# Patient Record
Sex: Female | Born: 2000
Health system: Southern US, Community
[De-identification: ages and names within clinical notes are randomized; demographics above are authoritative.]

## PROBLEM LIST (undated history)

## (undated) DIAGNOSIS — F909 Attention-deficit hyperactivity disorder, unspecified type: Secondary | ICD-10-CM

## (undated) DIAGNOSIS — Z68.41 Body mass index (BMI) pediatric, greater than or equal to 95th percentile for age: Secondary | ICD-10-CM

## (undated) DIAGNOSIS — Z8701 Personal history of pneumonia (recurrent): Secondary | ICD-10-CM

## (undated) DIAGNOSIS — F4322 Adjustment disorder with anxiety: Secondary | ICD-10-CM

## (undated) DIAGNOSIS — E049 Nontoxic goiter, unspecified: Secondary | ICD-10-CM

## (undated) DIAGNOSIS — K219 Gastro-esophageal reflux disease without esophagitis: Secondary | ICD-10-CM

## (undated) DIAGNOSIS — T7840XA Allergy, unspecified, initial encounter: Secondary | ICD-10-CM

## (undated) HISTORY — DX: Nontoxic goiter, unspecified: E04.9

## (undated) HISTORY — DX: Attention-deficit hyperactivity disorder, unspecified type: F90.9

## (undated) HISTORY — DX: Gastro-esophageal reflux disease without esophagitis: K21.9

## (undated) HISTORY — DX: Allergy, unspecified, initial encounter: T78.40XA

## (undated) HISTORY — DX: Personal history of pneumonia (recurrent): Z87.01

## (undated) HISTORY — DX: Adjustment disorder with anxiety: F43.22

## (undated) HISTORY — DX: Body mass index (bmi) pediatric, greater than or equal to 95th percentile for age: Z68.54

---
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
## 2019-01-05 DIAGNOSIS — F4325 Adjustment disorder with mixed disturbance of emotions and conduct: Secondary | ICD-10-CM | POA: Diagnosis not present

## 2019-01-19 DIAGNOSIS — F4325 Adjustment disorder with mixed disturbance of emotions and conduct: Secondary | ICD-10-CM | POA: Diagnosis not present

## 2019-02-02 DIAGNOSIS — F4325 Adjustment disorder with mixed disturbance of emotions and conduct: Secondary | ICD-10-CM | POA: Diagnosis not present

## 2019-02-16 DIAGNOSIS — F4325 Adjustment disorder with mixed disturbance of emotions and conduct: Secondary | ICD-10-CM | POA: Diagnosis not present

## 2019-03-02 DIAGNOSIS — F4325 Adjustment disorder with mixed disturbance of emotions and conduct: Secondary | ICD-10-CM | POA: Diagnosis not present

## 2019-03-15 DIAGNOSIS — F4325 Adjustment disorder with mixed disturbance of emotions and conduct: Secondary | ICD-10-CM | POA: Diagnosis not present

## 2019-03-29 DIAGNOSIS — F4325 Adjustment disorder with mixed disturbance of emotions and conduct: Secondary | ICD-10-CM | POA: Diagnosis not present

## 2019-04-12 DIAGNOSIS — F4325 Adjustment disorder with mixed disturbance of emotions and conduct: Secondary | ICD-10-CM | POA: Diagnosis not present

## 2019-04-29 DIAGNOSIS — F4325 Adjustment disorder with mixed disturbance of emotions and conduct: Secondary | ICD-10-CM | POA: Diagnosis not present

## 2019-05-10 DIAGNOSIS — F4325 Adjustment disorder with mixed disturbance of emotions and conduct: Secondary | ICD-10-CM | POA: Diagnosis not present

## 2019-05-24 DIAGNOSIS — F4325 Adjustment disorder with mixed disturbance of emotions and conduct: Secondary | ICD-10-CM | POA: Diagnosis not present

## 2019-06-03 ENCOUNTER — Encounter: Payer: Self-pay | Admitting: Internal Medicine

## 2019-06-03 ENCOUNTER — Other Ambulatory Visit: Payer: Self-pay

## 2019-06-03 ENCOUNTER — Ambulatory Visit (INDEPENDENT_AMBULATORY_CARE_PROVIDER_SITE_OTHER): Payer: BC Managed Care – PPO | Admitting: Internal Medicine

## 2019-06-03 VITALS — BP 120/72 | HR 117 | Temp 98.7°F | Wt 183.4 lb

## 2019-06-03 DIAGNOSIS — J45909 Unspecified asthma, uncomplicated: Secondary | ICD-10-CM

## 2019-06-03 DIAGNOSIS — F4321 Adjustment disorder with depressed mood: Secondary | ICD-10-CM

## 2019-06-03 DIAGNOSIS — Z79899 Other long term (current) drug therapy: Secondary | ICD-10-CM | POA: Diagnosis not present

## 2019-06-03 DIAGNOSIS — F909 Attention-deficit hyperactivity disorder, unspecified type: Secondary | ICD-10-CM | POA: Diagnosis not present

## 2019-06-03 MED ORDER — METHYLPHENIDATE HCL ER 10 MG PO TBCR: 10.0000 mg | EXTENDED_RELEASE_TABLET | Freq: Every day | ORAL | 0 refills | Status: DC

## 2019-06-03 NOTE — Progress Notes (Signed)
This visit occurred during the SARS-CoV-2 public health emergency.  Safety protocols were in place, including screening questions prior to the visit, additional usage of staff PPE, and extensive cleaning of exam room while observing appropriate contact time as indicated for disinfecting solutions.     Chief Complaint  Patient presents with  . Medication Refill    pt also wants to discuss depression   . Asthma  . ADHD    HPI: Sydney [name] 19 y.o. come in for Chronic disease management   Asthma :   Rescue recently    Days out fo  Out of  Daily   Controller  About  2 weeks.  Using moms a bit.  Allergy triggers  Pets  2 dogs and  cats  adhd and meds  Not taking regular   Cause out of school   Dropped out of school   And in [place] out and then accommodations  weren't ablt to help .   Difficult   Tutors  3 x per week .  2 hours .  [place].    College.   [employer] plans .   Fall .    AM arising  7 30  Tutoring 10 - 2 .    Mood :   Pandemic  A problem  For mood   Shuts down  Stays in room . Not edaing  emitional all the time   Readings    Escapes.  Friend  hwhose mom has MS.   Not related to  period has counselor every 2 weeks  Not suicidal see phq9 8   No tad had episode of etoh and got sick  Mom and fa,mily supportive to  Avoid  Pt aware  ROS: See pertinent positives and negatives per HPI.  Past Medical History:  Diagnosis Date  . ADHD (attention deficit hyperactivity disorder)   . Adjustment disorder with anxious mood 09/26/2014   Is problematic and affecting behavior see multiple changes in caretakers over time but good support advised developmental pediatrician pediatric psych, counseli   . Allergy   . Asthma   . BMI (body mass index), pediatric, 95-99% for age 26/13/2014   counseled    . Family disruption 07/28/2010  . GERD (gastroesophageal reflux disease)   . Goiter   . Hx: recurrent pneumonia    seen [facility]  neg   asthma related    Family History  Problem Relation Age of  Onset  . Asthma Mother   . Bipolar disorder Mother        hx of substance abuse in past   . Thyroid disease Mother   . Other Mother        djd c spine myelopathy, drug abuse      Outpatient Medications Prior to Visit  Medication Sig Dispense Refill  . albuterol (PROVENTIL) (2.5 MG/3ML) 0.083% nebulizer solution USE 1 VIAL VIA NEBULIZER EVERY 6 HOURS AS NEEDED FOR WHEEZING 1080 mL 0  . albuterol (VENTOLIN HFA) 108 (90 Base) MCG/ACT inhaler NHALE 2 PUFFS INTO LUNGS EVERY 6 HRS AS NEEDED FOR WHEEZING 54 Inhaler 0  . ARNUITY ELLIPTA 100 MCG/ACT AEPB INHALE 1 PUFF DAILY 30 each 4  . Fluticasone Furoate (ARNUITY ELLIPTA) 100 MCG/ACT AEPB Inhale 1 Act into the lungs daily. 3 each 3  . MELATONIN PO Take by mouth.    . methylphenidate (RITALIN) 5 MG tablet At 2 pm on long school days or as directed 45 tablet 0  . methylphenidate (CONCERTA) 36 MG  PO CR tablet Take 1 tablet (36 mg total) by mouth daily. 30 tablet 0   No facility-administered medications prior to visit.     EXAM:  BP 120/72 (BP Location: Right Arm, Patient Position: Sitting, Cuff Size: Normal)   Pulse (!) 117   Temp 98.7 F (37.1 C) (Temporal)   Wt 183 lb 6.4 oz (83.2 kg)   SpO2 99%   BMI 29.60 kg/m   Body mass index is 29.6 kg/m.  GENERAL: vitals reviewed and listed above, alert, oriented, appears well hydrated and in no acute distress HEENT: atraumatic, conjunctiva  clear, no obvious abnormalities on inspection of external nose and ears OP : masked  NECK: no obvious masses on inspection palpation  LUNGS: clear to auscultation bilaterally, no wheezes, rales or rhonchi, good air movement CV: HRRR, no clubbing cyanosis or  peripheral edema nl cap refill  MS: moves all extremities without noticeable focal  abnormality PSYCH: pleasant and cooperative, no obvious depression or anxiety nl speech and good insight  PHQ 9- 6  Lab Results  Component Value Date   WBC 8.3 10/09/2017   HGB 13.4 10/09/2017   HCT 40.0  10/09/2017   PLT 355.0 10/09/2017   GLUCOSE 94 10/09/2017   CHOL 148 10/09/2017   TRIG 34.0 10/09/2017   HDL 67.70 10/09/2017   LDLCALC 73 10/09/2017   ALT 16 10/09/2017   AST 19 10/09/2017   NA 139 10/09/2017   K 4.5 10/09/2017   CL 103 10/09/2017   CREATININE 0.54 10/09/2017   BUN 8 10/09/2017   CO2 27 10/09/2017   TSH 1.23 10/09/2017   BP Readings from Last 3 Encounters:  06/03/19 120/72  05/11/18 114/62  10/09/17 (!) 120/60 (79 %, Z = 0.82 /  21 %, Z = -0.79)*   *BP percentiles are based on the 2017 AAP Clinical Practice Guideline for girls    ASSESSMENT AND PLAN:  Discussed the following assessment and plan:  Attention deficit hyperactivity disorder (ADHD), unspecified ADHD type  Uncomplicated asthma, unspecified asthma severity, unspecified whether persistent - cost of controller to expensive check with other covered   Medication management  Adjustment disorder with depressed mood - realted to pandemic shut down first year collefe  aggravated  mom family hx of sud   is supportive, pt with adhd and ld Discussed using intermediate action methylphenidate as opposed to as needed immediate release.  Tried extended release Metadate 10 mg consideration of increasing to 20 mg. She states that the Concerta may have aggravated anxiety at some point. Insurance coverage for her controller inhaler which since she has been off is had to use her rescue more often. She will check with insurance sign up for my chart send [place] the information about which controllers inhalers are preferred in her insurance plan and check for coupons online.  Continue her counseling discussed strategies at this point medication not indicated and she is not wanting to do this anyway Plan follow-up visit in about 2 months to assess all status. Outside external source  DATA REVIEWED:   Total time on date  of service including record review ordering and plan of care:   Plan  40 minutes   -Patient advised  to return or notify health care team  if  new concerns arise.  Patient Instructions  Good to see you today  Contact insurance  About which controller inhjalres fopr asthma are preferred and covered   At best tier   We can use  Combo  Controllers  symbicort  hfa advair  Discus or hfa  dulera To name few    Otherwise flovent q var or pulmicort or similar   Try medium lasting  Methylphenidate  5-8 hours lasting low dose.  Continue therapy  Get out side daylight every day  Try other readings  Try socialization  Time   Plan rov virtual or other in about 2 months or as needed to see how doing with asthma and meds etc    Neta Mends. Lanore Renderos M.D.

## 2019-06-03 NOTE — Patient Instructions (Addendum)
Good to see you today  Contact insurance  About which controller inhjalres fopr asthma are preferred and covered   At best tier   We can use  Combo  Controllers  symbicort  hfa advair  Discus or hfa  dulera To name few    Otherwise flovent q var or pulmicort or similar   Try medium lasting  Methylphenidate  5-8 hours lasting low dose.  Continue therapy  Get out side daylight every day  Try other readings  Try socialization  Time   Plan rov virtual or other in about 2 months or as needed to see how doing with asthma and meds etc

## 2019-06-07 DIAGNOSIS — F4325 Adjustment disorder with mixed disturbance of emotions and conduct: Secondary | ICD-10-CM | POA: Diagnosis not present

## 2019-06-21 DIAGNOSIS — F4325 Adjustment disorder with mixed disturbance of emotions and conduct: Secondary | ICD-10-CM | POA: Diagnosis not present

## 2019-07-05 DIAGNOSIS — F4325 Adjustment disorder with mixed disturbance of emotions and conduct: Secondary | ICD-10-CM | POA: Diagnosis not present

## 2019-07-19 DIAGNOSIS — F4325 Adjustment disorder with mixed disturbance of emotions and conduct: Secondary | ICD-10-CM | POA: Diagnosis not present

## 2019-08-01 ENCOUNTER — Telehealth: Payer: BC Managed Care – PPO | Admitting: Internal Medicine

## 2019-08-16 DIAGNOSIS — F4325 Adjustment disorder with mixed disturbance of emotions and conduct: Secondary | ICD-10-CM | POA: Diagnosis not present

## 2019-08-30 DIAGNOSIS — F4325 Adjustment disorder with mixed disturbance of emotions and conduct: Secondary | ICD-10-CM | POA: Diagnosis not present

## 2019-09-13 DIAGNOSIS — F4325 Adjustment disorder with mixed disturbance of emotions and conduct: Secondary | ICD-10-CM | POA: Diagnosis not present

## 2019-10-05 DIAGNOSIS — F4325 Adjustment disorder with mixed disturbance of emotions and conduct: Secondary | ICD-10-CM | POA: Diagnosis not present

## 2019-10-07 ENCOUNTER — Other Ambulatory Visit: Payer: Self-pay | Admitting: Internal Medicine

## 2019-10-27 DIAGNOSIS — F4325 Adjustment disorder with mixed disturbance of emotions and conduct: Secondary | ICD-10-CM | POA: Diagnosis not present

## 2019-12-05 ENCOUNTER — Telehealth: Payer: Self-pay | Admitting: Internal Medicine

## 2019-12-05 NOTE — Telephone Encounter (Signed)
Pt call and want a refill on methylphenidate 10 MG ER tablet for a 30 days supply  [id number] [place], [place] - [address] Phone:  [phone]  Fax:  [phone]

## 2019-12-05 NOTE — Telephone Encounter (Signed)
Last OV 06/03/2019  Last filled 06/03/2019, # 30 with 0 refills

## 2019-12-07 MED ORDER — METHYLPHENIDATE HCL ER 10 MG PO TBCR: 10.0000 mg | EXTENDED_RELEASE_TABLET | Freq: Every day | ORAL | 0 refills | Status: DC

## 2019-12-07 NOTE — Telephone Encounter (Signed)
Sent in electronically .  Needs visit before further refills

## 2019-12-07 NOTE — Addendum Note (Signed)
Addended byBerniece Andreas K on: 12/07/2019 01:46 PM   Modules accepted: Orders

## 2019-12-07 NOTE — Telephone Encounter (Signed)
Needs  Fu 6 months after this refill

## 2019-12-08 NOTE — Telephone Encounter (Signed)
Called patient and scheduled a follow up visit on 12/14/2019 at 2:30pm. Patient verbalized an understanding.

## 2019-12-13 ENCOUNTER — Other Ambulatory Visit: Payer: Self-pay

## 2019-12-14 ENCOUNTER — Encounter: Payer: Self-pay | Admitting: Internal Medicine

## 2019-12-14 ENCOUNTER — Ambulatory Visit (INDEPENDENT_AMBULATORY_CARE_PROVIDER_SITE_OTHER): Payer: Self-pay | Admitting: Internal Medicine

## 2019-12-14 VITALS — BP 116/76 | HR 130 | Temp 99.2°F | Ht 66.0 in | Wt 173.4 lb

## 2019-12-14 DIAGNOSIS — F909 Attention-deficit hyperactivity disorder, unspecified type: Secondary | ICD-10-CM

## 2019-12-14 DIAGNOSIS — J45909 Unspecified asthma, uncomplicated: Secondary | ICD-10-CM

## 2019-12-14 DIAGNOSIS — Z79899 Other long term (current) drug therapy: Secondary | ICD-10-CM

## 2019-12-14 MED ORDER — FLOVENT HFA 110 MCG/ACT IN AERO: 1.0000 | INHALATION_SPRAY | Freq: Two times a day (BID) | RESPIRATORY_TRACT | 5 refills | Status: DC

## 2019-12-14 NOTE — Patient Instructions (Signed)
Sent in  Preventive inhaler Flovent to use 1 puff  Twice a day   ( once a day may be enough )   Try taking 20 mg pr ER methylphenidate   Late morning mid day for your classes and studies  ( and work )  Then  Contact  Me in about 2 weeks  About  Dosing .    depending  We can  rx for 20 mg per day  Instead of add on med .   Otherwise   rov in 4-6 months

## 2019-12-14 NOTE — Progress Notes (Signed)
Chief Complaint  Patient presents with  . Follow-up    Doing okay  . Medication Management  . Asthma  . ADHD    HPI: Sydney [name] 19 y.o. come in for Chronic disease management   ADHD/ADD    [employer] 2 classes eng and psych 11 am and 3 pm class med   Er last about 3 hours  Ir 1 hour   If takes 10 er late then effects sleep       Wonders if add on ir vs  Other  Had mood ans se of adderall   Asthma : Summer did ok.  This   at night  2 puffs  Controller inhaler  Not covered by  [employer] and when called said cant tell her what is covered  flovent   Using her moms  Extra with some help   Sleep   6-7 hours  No tad activity yoga 2 x per week    And personal trainer .  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   Mood:  Doing ok  Not as stressed   About school but "ok"   Still has therapist   ROS: See pertinent positives and negatives per HPI. No cp  Periods ok   Past Medical History:  Diagnosis Date  . ADHD (attention deficit hyperactivity disorder)   . Adjustment disorder with anxious mood 09/26/2014   Is problematic and affecting behavior see multiple changes in caretakers over time but good support advised developmental pediatrician pediatric psych, counseli   . Allergy   . Asthma   . BMI (body mass index), pediatric, 95-99% for age 62/13/2014   counseled    . Family disruption 07/28/2010  . GERD (gastroesophageal reflux disease)   . Goiter   . Hx: recurrent pneumonia    seen [facility]  neg   asthma related    Family History  Problem Relation Age of Onset  . Asthma Mother   . Bipolar disorder Mother        hx of substance abuse in past   . Thyroid disease Mother   . Other Mother        djd c spine myelopathy, drug abuse    Social History   Socioeconomic History  . Marital status: Single    Spouse name: Not on file  . Number of children: Not on file  . Years of education: Not on file  . Highest education level: Not on file  Occupational History  . Not on  file  Tobacco Use  . Smoking status: Never Smoker  . Smokeless tobacco: Never Used  Vaping Use  . Vaping Use: Never used  Substance and Sexual Activity  . Alcohol use: No  . Drug use: No  . Sexual activity: Not on file  Other Topics Concern  . Not on file  Social History Narrative   zwas living with gm for 2-3 months 2015    Pet schnauzer   [employer]  7th grade    Mom remarried   Husband left  Mom Jan 2012   Mom relapsed with codien given for her chronic neck pain and tried to detox herself   rehab and doing well as expected.     MOM relapsed again  2015    To live with aunt in [place]  For now   2016 mom is now one year sober Sydney [name] is living with her in an apartment in [place].   To be  home schooled 9th grade    Social Determinants of Health   Financial Resource Strain:   . Difficulty of Paying Living Expenses: Not on file  Food Insecurity:   . Worried About Programme researcher, broadcasting/film/video in the Last Year: Not on file  . Ran Out of Food in the Last Year: Not on file  Transportation Needs:   . Lack of Transportation (Medical): Not on file  . Lack of Transportation (Non-Medical): Not on file  Physical Activity:   . Days of Exercise per Week: Not on file  . Minutes of Exercise per Session: Not on file  Stress:   . Feeling of Stress : Not on file  Social Connections:   . Frequency of Communication with Friends and Family: Not on file  . Frequency of Social Gatherings with Friends and Family: Not on file  . Attends Religious Services: Not on file  . Active Member of Clubs or Organizations: Not on file  . Attends Banker Meetings: Not on file  . Marital Status: Not on file    Outpatient Medications Prior to Visit  Medication Sig Dispense Refill  . albuterol (PROVENTIL) (2.5 MG/3ML) 0.083% nebulizer solution USE 1 VIAL VIA NEBULIZER EVERY 6 HOURS AS NEEDED FOR WHEEZING 1080 mL 0  . albuterol (VENTOLIN HFA) 108 (90 Base) MCG/ACT inhaler INHALE  2 PUFFS INTO LUNGS EVERY 6 HOURS AS NEEDED FOR WHEEZING 18 g 1  . ARNUITY ELLIPTA 100 MCG/ACT AEPB INHALE 1 PUFF DAILY 30 each 4  . Fluticasone Furoate (ARNUITY ELLIPTA) 100 MCG/ACT AEPB Inhale 1 Act into the lungs daily. 3 each 3  . MELATONIN PO Take by mouth.    . methylphenidate 10 MG ER tablet Take 1 tablet (10 mg total) by mouth daily. Make appt before  any further refills 30 tablet 0  . methylphenidate (RITALIN) 5 MG tablet At 2 pm on long school days or as directed (Patient not taking: Reported on 12/14/2019) 45 tablet 0   No facility-administered medications prior to visit.     EXAM:  BP 116/76   Pulse (!) 130   Temp 99.2 F (37.3 C) (Oral)   Ht 5\' 6"  (1.676 m)   Wt 173 lb 6.4 oz (78.7 kg)   SpO2 98%   BMI 27.99 kg/m   Body mass index is 27.99 kg/m. Wt Readings from Last 3 Encounters:  12/14/19 173 lb 6.4 oz (78.7 kg)  06/03/19 183 lb 6.4 oz (83.2 kg) (95 %, Z= 1.67)*  05/11/18 151 lb 3.2 oz (68.6 kg) (84 %, Z= 1.00)*   * Growth percentiles are based on CDC (Girls, 2-20 Years) data.    GENERAL: vitals reviewed and listed above, alert, oriented, appears well hydrated and in no acute distress HEENT: atraumatic, conjunctiva  clear, no obvious abnormalities on inspection of external nose and ears OP : masked  NECK: no obvious masses on inspection palpation  LUNGS: clear to auscultation bilaterally, no wheezes, rales or rhonchi, good air movement CV: HRRR, no clubbing cyanosis or  peripheral edema nl cap refill  Abdomen:  Sof,t normal bowel sounds without hepatosplenomegaly, no guarding rebound or masses no CVA tenderness MS: moves all extremities without noticeable focal  abnormality PSYCH: pleasant and cooperative, no obvious depression or anxiety Lab Results  Component Value Date   WBC 8.3 10/09/2017   HGB 13.4 10/09/2017   HCT 40.0 10/09/2017   PLT 355.0 10/09/2017   GLUCOSE 94 10/09/2017   CHOL 148 10/09/2017   TRIG 34.0 10/09/2017  HDL 67.70 10/09/2017    LDLCALC 73 10/09/2017   ALT 16 10/09/2017   AST 19 10/09/2017   NA 139 10/09/2017   K 4.5 10/09/2017   CL 103 10/09/2017   CREATININE 0.54 10/09/2017   BUN 8 10/09/2017   CO2 27 10/09/2017   TSH 1.23 10/09/2017   BP Readings from Last 3 Encounters:  12/14/19 116/76  06/03/19 120/72  05/11/18 114/62    ASSESSMENT AND PLAN:  Discussed the following assessment and plan:  Attention deficit hyperactivity disorder (ADHD), unspecified ADHD type  Uncomplicated asthma, unspecified asthma severity, unspecified whether persistent  Medication management - insurance barrier  rx flovent to see if paid  Try 20 mg er methylphenidate  late am and see how covers time for class and  Studies without effecting sleep  34 minutes reviewe visit disc and counsel -Patient advised to return or notify health care team  if  new concerns arise.  Patient Instructions  Sent in  Preventive inhaler Flovent to use 1 puff  Twice a day   ( once a day may be enough )   Try taking 20 mg pr ER methylphenidate   Late morning mid day for your classes and studies  ( and work )  Then  Contact  Me in about 2 weeks  About  Dosing .    depending  We can  rx for 20 mg per day  Instead of add on med .   Otherwise   rov in 4-6 months          Neta Mends. Erian Rosengren M.D.

## 2019-12-26 DIAGNOSIS — F411 Generalized anxiety disorder: Secondary | ICD-10-CM | POA: Diagnosis not present

## 2020-01-09 DIAGNOSIS — F411 Generalized anxiety disorder: Secondary | ICD-10-CM | POA: Diagnosis not present

## 2020-01-23 DIAGNOSIS — F411 Generalized anxiety disorder: Secondary | ICD-10-CM | POA: Diagnosis not present

## 2020-02-20 DIAGNOSIS — F411 Generalized anxiety disorder: Secondary | ICD-10-CM | POA: Diagnosis not present

## 2020-02-27 ENCOUNTER — Encounter: Payer: Self-pay | Admitting: Internal Medicine

## 2020-02-27 ENCOUNTER — Other Ambulatory Visit: Payer: Self-pay

## 2020-02-27 ENCOUNTER — Ambulatory Visit: Payer: BC Managed Care – PPO | Admitting: Internal Medicine

## 2020-02-27 VITALS — BP 110/60 | HR 113 | Temp 98.9°F | Ht 66.0 in | Wt 175.0 lb

## 2020-02-27 DIAGNOSIS — M25562 Pain in left knee: Secondary | ICD-10-CM

## 2020-02-27 DIAGNOSIS — M222X2 Patellofemoral disorders, left knee: Secondary | ICD-10-CM | POA: Diagnosis not present

## 2020-02-27 NOTE — Progress Notes (Signed)
Chief Complaint  Patient presents with  . Knee Pain    left knee pain    HPI: Sydney [name] 19 y.o. come in for  New problem knee pain .  Left knee has been bothering her for couple of months beginning as a pain peripatellar lateral without a specific injury that has progressed over time so that now it is hurting her badly every time she walks up the stairs.   Notices it is yoga but no specific injury or fall or history of same  Excruciating at times  And not so painful popping.  When she is turning. No extra  Exercise walking and yoga.  Happens when she is going upstairs walking a certain way but not when sedentary. ROS: See pertinent positives and negatives per HPI.  Past Medical History:  Diagnosis Date  . ADHD (attention deficit hyperactivity disorder)   . Adjustment disorder with anxious mood 09/26/2014   Is problematic and affecting behavior see multiple changes in caretakers over time but good support advised developmental pediatrician pediatric psych, counseli   . Allergy   . Asthma   . BMI (body mass index), pediatric, 95-99% for age 83/13/2014   counseled    . Family disruption 07/28/2010  . GERD (gastroesophageal reflux disease)   . Goiter   . Hx: recurrent pneumonia    seen [facility]  neg   asthma related    Family History  Problem Relation Age of Onset  . Asthma Mother   . Bipolar disorder Mother        hx of substance abuse in past   . Thyroid disease Mother   . Other Mother        djd c spine myelopathy, drug abuse    Social History   Socioeconomic History  . Marital status: Single    Spouse name: Not on file  . Number of children: Not on file  . Years of education: Not on file  . Highest education level: Not on file  Occupational History  . Not on file  Tobacco Use  . Smoking status: Never Smoker  . Smokeless tobacco: Never Used  Vaping Use  . Vaping Use: Never used  Substance and Sexual Activity  . Alcohol use: No  . Drug use: No  . Sexual  activity: Not on file  Other Topics Concern  . Not on file  Social History Narrative   zwas living with gm for 2-3 months 2015    Pet schnauzer   [employer]  7th grade    Mom remarried   Husband left  Mom Jan 2012   Mom relapsed with codien given for her chronic neck pain and tried to detox herself   rehab and doing well as expected.     MOM relapsed again  2015    To live with aunt in [place]  For now   2016 mom is now one year sober [place] is living with her in an apartment in [place].   To be home schooled 9th grade    Social Determinants of Health   Financial Resource Strain:   . Difficulty of Paying Living Expenses: Not on file  Food Insecurity:   . Worried About Programme researcher, broadcasting/film/video in the Last Year: Not on file  . Ran Out of Food in the Last Year: Not on file  Transportation Needs:   . Lack of Transportation (Medical): Not on file  . Lack of Transportation (Non-Medical): Not on file  Physical Activity:   . Days of Exercise per Week: Not on file  . Minutes of Exercise per Session: Not on file  Stress:   . Feeling of Stress : Not on file  Social Connections:   . Frequency of Communication with Friends and Family: Not on file  . Frequency of Social Gatherings with Friends and Family: Not on file  . Attends Religious Services: Not on file  . Active Member of Clubs or Organizations: Not on file  . Attends Banker Meetings: Not on file  . Marital Status: Not on file    Outpatient Medications Prior to Visit  Medication Sig Dispense Refill  . albuterol (PROVENTIL) (2.5 MG/3ML) 0.083% nebulizer solution USE 1 VIAL VIA NEBULIZER EVERY 6 HOURS AS NEEDED FOR WHEEZING 1080 mL 0  . albuterol (VENTOLIN HFA) 108 (90 Base) MCG/ACT inhaler INHALE 2 PUFFS INTO LUNGS EVERY 6 HOURS AS NEEDED FOR WHEEZING 18 g 1  . fluticasone (FLOVENT HFA) 110 MCG/ACT inhaler Inhale 1 puff into the lungs in the morning and at bedtime. For asthma control 1 each 5  .  MELATONIN PO Take by mouth.    . methylphenidate (RITALIN) 5 MG tablet At 2 pm on long school days or as directed 45 tablet 0  . ARNUITY ELLIPTA 100 MCG/ACT AEPB INHALE 1 PUFF DAILY (Patient not taking: Reported on 02/27/2020) 30 each 4  . Fluticasone Furoate (ARNUITY ELLIPTA) 100 MCG/ACT AEPB Inhale 1 Act into the lungs daily. (Patient not taking: Reported on 02/27/2020) 3 each 3  . methylphenidate 10 MG ER tablet Take 1 tablet (10 mg total) by mouth daily. Make appt before  any further refills (Patient not taking: Reported on 02/27/2020) 30 tablet 0   No facility-administered medications prior to visit.     EXAM:  BP 110/60   Pulse (!) 113   Temp 98.9 F (37.2 C) (Oral)   Ht 5\' 6"  (1.676 m)   Wt 175 lb (79.4 kg)   SpO2 99%   BMI 28.25 kg/m   Body mass index is 28.25 kg/m.  GENERAL: vitals reviewed and listed above, alert, oriented, appears well hydrated and in no acute distress HEENT: atraumatic, conjunctiva  clear, no obvious abnormalities on inspection of external nose and ears OP : Masked NECK: no obvious masses on inspection palpation   CV: HRRR, no clubbing cyanosis or  peripheral edema nl cap refill  MS: moves all extremities without noticeable focal  abnormality no warmth or redness left knee slight peripatellar effusion range of motion normal passive slight grinding positive apprehension points to tenderness in the lateral patellar area no locking noted. PSYCH: pleasant and cooperative, no obvious depression or anxiety  BP Readings from Last 3 Encounters:  02/27/20 110/60  12/14/19 116/76  06/03/19 120/72    ASSESSMENT AND PLAN:  Discussed the following assessment and plan:  Left knee pain, unspecified chronicity - Plan: Ambulatory referral to Sports Medicine  Anterior knee pain, left - Plan: Ambulatory referral to Sports Medicine  Patellofemoral disorder of left knee - Plan: Ambulatory referral to Sports Medicine Left lateral peripatellar pain progressing  and problematic with walking up stairs without a specific injury Expectant management temporary ice anti-inflammatory topical Referral to sports medicine for further evaluation treatment management. Discussed bracing and potential differential diagnosis.  -Patient advised to return or notify health care team  if  new concerns arise.  Patient Instructions  consider  Patellar tracking   Problem  And possible   patellar subluxation .  Often  treated with  Physical therapy bracing etc .   Ice at end of day .   Topical Voltaren  Gel or similar otc   Up to 4 x pr day ( antiinflammatory . ) ' You will   be contacted about sport medicine referral .      Patellofemoral Pain Syndrome  Patellofemoral pain syndrome is a condition in which the tissue (cartilage) on the underside of the kneecap (patella) softens or breaks down. This causes pain in the front of the knee. The condition is also called runner's knee or chondromalacia patella. Patellofemoral pain syndrome is most common in young adults who are active in sports. The knee is the largest joint in the body. The patella covers the front of the knee and is attached to muscles above and below the knee. The underside of the patella is covered with a smooth type of cartilage (synovium). The smooth surface helps the patella to glide easily when you move your knee. Patellofemoral pain syndrome causes swelling in the joint linings and bone surfaces in the knee. What are the causes? This condition may be caused by:  Overuse of the knee.  Poor alignment of your knee joints.  Weak leg muscles.  A direct blow to your kneecap. What increases the risk? You are more likely to develop this condition if:  You do a lot of activities that can wear down your kneecap. These include: ? Running. ? Squatting. ? Climbing stairs.  You start a new physical activity or exercise program.  You wear shoes that do not fit well.  You do not have good leg  strength.  You are overweight. What are the signs or symptoms? The main symptom of this condition is knee pain. This may feel like a dull, aching pain underneath your patella, in the front of your knee. There may be a popping or cracking sound when you move your knee. Pain may get worse with:  Exercise.  Climbing stairs.  Running.  Jumping.  Squatting.  Kneeling.  Sitting for a long time.  Moving or pushing on your patella. How is this diagnosed? This condition may be diagnosed based on:  Your symptoms and medical history. You may be asked about your recent physical activities and which ones cause knee pain.  A physical exam. This may include: ? Moving your patella back and forth. ? Checking your range of knee motion. ? Having you squat or jump to see if you have pain. ? Checking the strength of your leg muscles.  Imaging tests to confirm the diagnosis. These may include an MRI of your knee. How is this treated? This condition may be treated at home with rest, ice, compression, and elevation (RICE).  Other treatments may include:  Nonsteroidal anti-inflammatory drugs (NSAIDs).  Physical therapy to stretch and strengthen your leg muscles.  Shoe inserts (orthotics) to take stress off your knee.  A knee brace or knee support.  Adhesive tapes to the skin.  Surgery to remove damaged cartilage or move the patella to a better position. This is rare. Follow these instructions at home: If you have a shoe or brace:  Wear the shoe or brace as told by your health care provider. Remove it only as told by your health care provider.  Loosen the shoe or brace if your toes tingle, become numb, or turn cold and blue.  Keep the shoe or brace clean.  If the shoe or brace is not waterproof: ? Do not let it get wet. ?  Cover it with a watertight covering when you take a bath or a shower. Managing pain, stiffness, and swelling  If directed, put ice on the painful area. ? If you  have a removable shoe or brace, remove it as told by your health care provider. ? Put ice in a plastic bag. ? Place a towel between your skin and the bag. ? Leave the ice on for 20 minutes, 2-3 times a day.  Move your toes often to avoid stiffness and to lessen swelling.  Rest your knee: ? Avoid activities that cause knee pain. ? When sitting or lying down, raise (elevate) the injured area above the level of your heart, whenever possible. General instructions  Take over-the-counter and prescription medicines only as told by your health care provider.  Use splints, braces, knee supports, or walking aids as directed by your health care provider.  Perform stretching and strengthening exercises as told by your health care provider or physical therapist.  Do not use any products that contain nicotine or tobacco, such as cigarettes and e-cigarettes. These can delay healing. If you need help quitting, ask your health care provider.  Return to your normal activities as told by your health care provider. Ask your health care provider what activities are safe for you.  Keep all follow-up visits as told by your health care provider. This is important. Contact a health care provider if:  Your symptoms get worse.  You are not improving with home care. Summary  Patellofemoral pain syndrome is a condition in which the tissue (cartilage) on the underside of the kneecap (patella) softens or breaks down.  This condition causes swelling in the joint linings and bone surfaces in the knee. This leads to pain in the front of the knee.  This condition may be treated at home with rest, ice, compression, and elevation (RICE).  Use splints, braces, knee supports, or walking aids as directed by your health care provider. This information is not intended to replace advice given to you by your health care provider. Make sure you discuss any questions you have with your health care provider. Document  Revised: 05/11/2017 Document Reviewed: 05/11/2017 Elsevier Patient Education  2020 [employer]        MorenciWanda K. Shaka Cardin M.D.

## 2020-02-27 NOTE — Patient Instructions (Addendum)
consider  Patellar tracking   Problem  And possible   patellar subluxation .  Often treated with  Physical therapy bracing etc .   Ice at end of day .   Topical Voltaren  Gel or similar otc   Up to 4 x pr day ( antiinflammatory . ) ' You will   be contacted about sport medicine referral .      Patellofemoral Pain Syndrome  Patellofemoral pain syndrome is a condition in which the tissue (cartilage) on the underside of the kneecap (patella) softens or breaks down. This causes pain in the front of the knee. The condition is also called runner's knee or chondromalacia patella. Patellofemoral pain syndrome is most common in young adults who are active in sports. The knee is the largest joint in the body. The patella covers the front of the knee and is attached to muscles above and below the knee. The underside of the patella is covered with a smooth type of cartilage (synovium). The smooth surface helps the patella to glide easily when you move your knee. Patellofemoral pain syndrome causes swelling in the joint linings and bone surfaces in the knee. What are the causes? This condition may be caused by:  Overuse of the knee.  Poor alignment of your knee joints.  Weak leg muscles.  A direct blow to your kneecap. What increases the risk? You are more likely to develop this condition if:  You do a lot of activities that can wear down your kneecap. These include: ? Running. ? Squatting. ? Climbing stairs.  You start a new physical activity or exercise program.  You wear shoes that do not fit well.  You do not have good leg strength.  You are overweight. What are the signs or symptoms? The main symptom of this condition is knee pain. This may feel like a dull, aching pain underneath your patella, in the front of your knee. There may be a popping or cracking sound when you move your knee. Pain may get worse with:  Exercise.  Climbing  stairs.  Running.  Jumping.  Squatting.  Kneeling.  Sitting for a long time.  Moving or pushing on your patella. How is this diagnosed? This condition may be diagnosed based on:  Your symptoms and medical history. You may be asked about your recent physical activities and which ones cause knee pain.  A physical exam. This may include: ? Moving your patella back and forth. ? Checking your range of knee motion. ? Having you squat or jump to see if you have pain. ? Checking the strength of your leg muscles.  Imaging tests to confirm the diagnosis. These may include an MRI of your knee. How is this treated? This condition may be treated at home with rest, ice, compression, and elevation (RICE).  Other treatments may include:  Nonsteroidal anti-inflammatory drugs (NSAIDs).  Physical therapy to stretch and strengthen your leg muscles.  Shoe inserts (orthotics) to take stress off your knee.  A knee brace or knee support.  Adhesive tapes to the skin.  Surgery to remove damaged cartilage or move the patella to a better position. This is rare. Follow these instructions at home: If you have a shoe or brace:  Wear the shoe or brace as told by your health care provider. Remove it only as told by your health care provider.  Loosen the shoe or brace if your toes tingle, become numb, or turn cold and blue.  Keep the shoe or brace  clean.  If the shoe or brace is not waterproof: ? Do not let it get wet. ? Cover it with a watertight covering when you take a bath or a shower. Managing pain, stiffness, and swelling  If directed, put ice on the painful area. ? If you have a removable shoe or brace, remove it as told by your health care provider. ? Put ice in a plastic bag. ? Place a towel between your skin and the bag. ? Leave the ice on for 20 minutes, 2-3 times a day.  Move your toes often to avoid stiffness and to lessen swelling.  Rest your knee: ? Avoid activities that  cause knee pain. ? When sitting or lying down, raise (elevate) the injured area above the level of your heart, whenever possible. General instructions  Take over-the-counter and prescription medicines only as told by your health care provider.  Use splints, braces, knee supports, or walking aids as directed by your health care provider.  Perform stretching and strengthening exercises as told by your health care provider or physical therapist.  Do not use any products that contain nicotine or tobacco, such as cigarettes and e-cigarettes. These can delay healing. If you need help quitting, ask your health care provider.  Return to your normal activities as told by your health care provider. Ask your health care provider what activities are safe for you.  Keep all follow-up visits as told by your health care provider. This is important. Contact a health care provider if:  Your symptoms get worse.  You are not improving with home care. Summary  Patellofemoral pain syndrome is a condition in which the tissue (cartilage) on the underside of the kneecap (patella) softens or breaks down.  This condition causes swelling in the joint linings and bone surfaces in the knee. This leads to pain in the front of the knee.  This condition may be treated at home with rest, ice, compression, and elevation (RICE).  Use splints, braces, knee supports, or walking aids as directed by your health care provider. This information is not intended to replace advice given to you by your health care provider. Make sure you discuss any questions you have with your health care provider. Document Revised: 05/11/2017 Document Reviewed: 05/11/2017 Elsevier Patient Education  2020 [employer].

## 2020-03-05 DIAGNOSIS — F411 Generalized anxiety disorder: Secondary | ICD-10-CM | POA: Diagnosis not present

## 2020-03-19 ENCOUNTER — Ambulatory Visit: Payer: BC Managed Care – PPO | Admitting: Family Medicine

## 2020-04-02 DIAGNOSIS — F411 Generalized anxiety disorder: Secondary | ICD-10-CM | POA: Diagnosis not present

## 2020-04-16 DIAGNOSIS — F411 Generalized anxiety disorder: Secondary | ICD-10-CM | POA: Diagnosis not present

## 2020-04-30 DIAGNOSIS — F411 Generalized anxiety disorder: Secondary | ICD-10-CM | POA: Diagnosis not present

## 2020-05-14 DIAGNOSIS — F411 Generalized anxiety disorder: Secondary | ICD-10-CM | POA: Diagnosis not present

## 2020-05-28 DIAGNOSIS — F4325 Adjustment disorder with mixed disturbance of emotions and conduct: Secondary | ICD-10-CM | POA: Diagnosis not present

## 2020-06-11 DIAGNOSIS — F411 Generalized anxiety disorder: Secondary | ICD-10-CM | POA: Diagnosis not present

## 2020-07-09 DIAGNOSIS — F411 Generalized anxiety disorder: Secondary | ICD-10-CM | POA: Diagnosis not present

## 2020-07-23 DIAGNOSIS — F411 Generalized anxiety disorder: Secondary | ICD-10-CM | POA: Diagnosis not present

## 2020-08-02 DIAGNOSIS — L700 Acne vulgaris: Secondary | ICD-10-CM | POA: Diagnosis not present

## 2020-08-02 DIAGNOSIS — L219 Seborrheic dermatitis, unspecified: Secondary | ICD-10-CM | POA: Diagnosis not present

## 2020-08-06 DIAGNOSIS — F411 Generalized anxiety disorder: Secondary | ICD-10-CM | POA: Diagnosis not present

## 2020-08-20 DIAGNOSIS — F411 Generalized anxiety disorder: Secondary | ICD-10-CM | POA: Diagnosis not present

## 2020-09-03 DIAGNOSIS — F411 Generalized anxiety disorder: Secondary | ICD-10-CM | POA: Diagnosis not present

## 2020-09-17 DIAGNOSIS — F411 Generalized anxiety disorder: Secondary | ICD-10-CM | POA: Diagnosis not present

## 2020-09-18 ENCOUNTER — Other Ambulatory Visit: Payer: Self-pay | Admitting: Internal Medicine

## 2020-10-01 DIAGNOSIS — F411 Generalized anxiety disorder: Secondary | ICD-10-CM | POA: Diagnosis not present

## 2020-10-15 DIAGNOSIS — F411 Generalized anxiety disorder: Secondary | ICD-10-CM | POA: Diagnosis not present

## 2020-10-29 DIAGNOSIS — F411 Generalized anxiety disorder: Secondary | ICD-10-CM | POA: Diagnosis not present

## 2020-11-02 DIAGNOSIS — L700 Acne vulgaris: Secondary | ICD-10-CM | POA: Diagnosis not present

## 2020-11-02 DIAGNOSIS — D2239 Melanocytic nevi of other parts of face: Secondary | ICD-10-CM | POA: Diagnosis not present

## 2020-11-26 DIAGNOSIS — F411 Generalized anxiety disorder: Secondary | ICD-10-CM | POA: Diagnosis not present

## 2020-12-10 DIAGNOSIS — F411 Generalized anxiety disorder: Secondary | ICD-10-CM | POA: Diagnosis not present

## 2020-12-24 DIAGNOSIS — F411 Generalized anxiety disorder: Secondary | ICD-10-CM | POA: Diagnosis not present

## 2021-01-07 DIAGNOSIS — F411 Generalized anxiety disorder: Secondary | ICD-10-CM | POA: Diagnosis not present

## 2021-01-21 DIAGNOSIS — F411 Generalized anxiety disorder: Secondary | ICD-10-CM | POA: Diagnosis not present

## 2021-01-24 ENCOUNTER — Telehealth: Payer: BC Managed Care – PPO | Admitting: Internal Medicine

## 2021-01-24 ENCOUNTER — Encounter: Payer: Self-pay | Admitting: Internal Medicine

## 2021-01-24 VITALS — Ht 66.0 in | Wt 175.0 lb

## 2021-01-24 DIAGNOSIS — Z79899 Other long term (current) drug therapy: Secondary | ICD-10-CM

## 2021-01-24 DIAGNOSIS — J45909 Unspecified asthma, uncomplicated: Secondary | ICD-10-CM

## 2021-01-24 DIAGNOSIS — F909 Attention-deficit hyperactivity disorder, unspecified type: Secondary | ICD-10-CM | POA: Diagnosis not present

## 2021-01-24 MED ORDER — ALBUTEROL SULFATE HFA 108 (90 BASE) MCG/ACT IN AERS: INHALATION_SPRAY | RESPIRATORY_TRACT | 3 refills | Status: DC

## 2021-01-24 MED ORDER — METHYLPHENIDATE HCL ER 20 MG PO TBCR: 20.0000 mg | EXTENDED_RELEASE_TABLET | Freq: Every day | ORAL | 0 refills | Status: DC

## 2021-01-24 MED ORDER — BUDESONIDE-FORMOTEROL FUMARATE 160-4.5 MCG/ACT IN AERO: 2.0000 | INHALATION_SPRAY | Freq: Two times a day (BID) | RESPIRATORY_TRACT | 6 refills | Status: DC

## 2021-01-24 NOTE — Progress Notes (Signed)
Virtual Visit via Video Note  I connected with Sydney [name]  on 01/24/21 at  9:00 AM EDT by a video enabled telemedicine application and verified that I am speaking with the correct person using two identifiers. Location patient: home Location provider:work or home office Persons participating in the virtual visit: patient, provider  [place] national recommendations  regarding COVID 19 pandemic   video visit is advised over in office visit for this patient.  Patient aware  of the limitations of evaluation and management by telemedicine and  availability of in person appointments. and agreed to proceed.   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  She is noticing that she is less focused off of the medicine and not doing as well and her mother noticed the same.  She had been taking 10 mg extended release methylphenidate did not last very long but was quite helpful in the past.  Would like to try a higher dose she had no significant side effects.  Sleep closer to 7 hours negative TD occasional rare alcohol.  It is physically active likes yoga. Currently living at home but will be having a different living arrangement in the near future.   Asthma: Has been pretty stable ran out of her controller medicine and has been using her mom's Symbicort which seems to be quite well occasional use of albuterol once every few weeks.  Would like to try Symbicort instead of her previous inhaler. Uncertain if she has refills on the albuterol.  Mood has been stable no significant depression anxiety at this point. She does have a yearly follow-up visit in November.  In person.  ROS: See pertinent positives and negatives per HPI.  Past Medical History:  Diagnosis Date   ADHD (attention deficit hyperactivity disorder)    Adjustment disorder with anxious mood 09/26/2014   Is  problematic and affecting behavior see multiple changes in caretakers over time but good support advised developmental pediatrician pediatric psych, counseli    Allergy    Asthma    BMI (body mass index), pediatric, 95-99% for age 48/13/2014   counseled     Family disruption 07/28/2010   GERD (gastroesophageal reflux disease)    Goiter    Hx: recurrent pneumonia    seen [facility]  neg   asthma related    History reviewed. No pertinent surgical history.  Family History  Problem Relation Age of Onset   Asthma Mother    Bipolar disorder Mother        hx of substance abuse in past    Thyroid disease Mother    Other Mother        djd c spine myelopathy, drug abuse    Social History   Tobacco Use   Smoking status: Never   Smokeless tobacco: Never  Vaping Use   Vaping Use: Never used  Substance Use Topics   Alcohol use: No   Drug use: No      Current Outpatient Medications:    albuterol (PROVENTIL) (2.5 MG/3ML) 0.083% nebulizer solution, USE 1 VIAL VIA NEBULIZER EVERY 6 HOURS AS NEEDED FOR WHEEZING, Disp: 1080 mL, Rfl: 0   budesonide-formoterol (SYMBICORT) 160-4.5 MCG/ACT inhaler, Inhale 2 puffs into the lungs 2 (two) times daily., Disp: 1 each, Rfl: 6   MELATONIN PO, Take  by mouth., Disp: , Rfl:    methylphenidate (METADATE ER) 20 MG ER tablet, Take 1 tablet (20 mg total) by mouth daily., Disp: 30 tablet, Rfl: 0   methylphenidate (RITALIN) 5 MG tablet, At 2 pm on long school days or as directed, Disp: 45 tablet, Rfl: 0   albuterol (VENTOLIN HFA) 108 (90 Base) MCG/ACT inhaler, INHALE 2 PUFFS INTO LUNGS EVERY 6 HOURS AS NEEDED FOR WHEEZING, Disp: 8.5 each, Rfl: 3  EXAM: BP Readings from Last 3 Encounters:  02/27/20 110/60  12/14/19 116/76  06/03/19 120/72    VITALS per patient if applicable:  GENERAL: alert, oriented, appears well and in no acute distress  HEENT: atraumatic, conjunttiva clear, no obvious abnormalities on inspection of external nose and ears  NECK:  normal movements of the head and neck  LUNGS: on inspection no signs of respiratory distress, breathing rate appears normal, no obvious gross SOB, gasping or wheezing  CV: no obvious cyanosis  MS: moves all visible extremities without noticeable abnormality  PSYCH/NEURO: pleasant and cooperative, no obvious depression or anxiety, speech and thought processing grossly intact Lab Results  Component Value Date   WBC 8.3 10/09/2017   HGB 13.4 10/09/2017   HCT 40.0 10/09/2017   PLT 355.0 10/09/2017   GLUCOSE 94 10/09/2017   CHOL 148 10/09/2017   TRIG 34.0 10/09/2017   HDL 67.70 10/09/2017   LDLCALC 73 10/09/2017   ALT 16 10/09/2017   AST 19 10/09/2017   NA 139 10/09/2017   K 4.5 10/09/2017   CL 103 10/09/2017   CREATININE 0.54 10/09/2017   BUN 8 10/09/2017   CO2 27 10/09/2017   TSH 1.23 10/09/2017    ASSESSMENT AND PLAN:  Discussed the following assessment and plan:    ICD-10-CM   1. Attention deficit hyperactivity disorder (ADHD), unspecified ADHD type  F90.9     2. Medication management  Z79.899     3. Uncomplicated asthma, unspecified asthma severity, unspecified whether persistent  J45.909      Agree  benefit from  restarting  stimulant med and inc dose to 20 mg   cont other life style helps.   Then fu at her pv med visit nex month  Asthma controlled  on ics laba using moms as ran out currently rare  sig falr can use rescue every few weeks.  Counseled.   Expectant management and discussion of plan and treatment with opportunity to ask questions and all were answered. The patient agreed with the plan and demonstrated an understanding of the instructions.   Advised to call back or seek an in-person evaluation if worsening  or having  further concerns . In niterim  Return for upcoming  in person visit  and fu med .    Berniece Andreas, MD

## 2021-02-04 DIAGNOSIS — L700 Acne vulgaris: Secondary | ICD-10-CM | POA: Diagnosis not present

## 2021-02-18 DIAGNOSIS — F411 Generalized anxiety disorder: Secondary | ICD-10-CM | POA: Diagnosis not present

## 2021-02-24 ENCOUNTER — Other Ambulatory Visit: Payer: Self-pay

## 2021-02-25 ENCOUNTER — Encounter: Payer: BC Managed Care – PPO | Admitting: Internal Medicine

## 2021-02-25 MED ORDER — METHYLPHENIDATE HCL ER 20 MG PO TBCR: 20.0000 mg | EXTENDED_RELEASE_TABLET | Freq: Every day | ORAL | 0 refills | Status: DC

## 2021-02-25 NOTE — Telephone Encounter (Signed)
Thanks for the update   I sent in refill

## 2021-02-25 NOTE — Telephone Encounter (Signed)
Sent in electronically .  

## 2021-03-04 DIAGNOSIS — F411 Generalized anxiety disorder: Secondary | ICD-10-CM | POA: Diagnosis not present

## 2021-03-18 DIAGNOSIS — F411 Generalized anxiety disorder: Secondary | ICD-10-CM | POA: Diagnosis not present

## 2021-04-10 ENCOUNTER — Encounter: Payer: Self-pay | Admitting: Internal Medicine

## 2021-04-10 ENCOUNTER — Ambulatory Visit (INDEPENDENT_AMBULATORY_CARE_PROVIDER_SITE_OTHER): Payer: BC Managed Care – PPO | Admitting: Internal Medicine

## 2021-04-10 VITALS — BP 114/80 | HR 96 | Temp 98.8°F | Ht 66.0 in | Wt 166.2 lb

## 2021-04-10 DIAGNOSIS — Z0000 Encounter for general adult medical examination without abnormal findings: Secondary | ICD-10-CM

## 2021-04-10 DIAGNOSIS — J45909 Unspecified asthma, uncomplicated: Secondary | ICD-10-CM | POA: Diagnosis not present

## 2021-04-10 DIAGNOSIS — Z2821 Immunization not carried out because of patient refusal: Secondary | ICD-10-CM

## 2021-04-10 DIAGNOSIS — F909 Attention-deficit hyperactivity disorder, unspecified type: Secondary | ICD-10-CM

## 2021-04-10 DIAGNOSIS — Z79899 Other long term (current) drug therapy: Secondary | ICD-10-CM | POA: Diagnosis not present

## 2021-04-10 MED ORDER — FLUTICASONE FUROATE-VILANTEROL 100-25 MCG/ACT IN AEPB: 1.0000 | INHALATION_SPRAY | Freq: Every day | RESPIRATORY_TRACT | 6 refills | Status: DC

## 2021-04-10 NOTE — Progress Notes (Signed)
Chief Complaint  Patient presents with   Annual Exam    Not fasting    HPI: Patient  DESIA SABAN  20 y.o. comes in today for Preventive Health Care visit  and med eval  ADHD methylphenidate  is Great to help her  8 30 am    tends when wears off slowly  .  Definitely by 5 pm .  No mood swings  and no sig untoward se.  Asthma recently a bit wonky  but not bad   rescue  about 3/7 days  at night  Periods irregular for 2 mos .  Bleeds 4 days  some cramps 2 d .    Lance on getting her GYN check declined to check for STI risk for pregnancy Health Maintenance  Topic Date Due   Pneumococcal Vaccine 32-36 Years old (2 - PCV) 12/27/2008   HIV Screening  Never done   Hepatitis C Screening  Never done   INFLUENZA VACCINE  11/12/2020   PAP-Cervical Cytology Screening  Never done   PAP SMEAR-Modifier  Never done   TETANUS/TDAP  12/02/2021   HPV VACCINES  Completed   Health Maintenance Review LIFESTYLE:  Exercise:    yoga and  puppy.  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  School in Jan soph  nursing [place].  Her mom is doing well  ROS:  REST of 12 system review negative except as per HPI   Past Medical History:  Diagnosis Date   ADHD (attention deficit hyperactivity disorder)    Adjustment disorder with anxious mood 09/26/2014   Is problematic and affecting behavior see multiple changes in caretakers over time but good support advised developmental pediatrician pediatric psych, counseli    Allergy    Asthma    BMI (body mass index), pediatric, 95-99% for age 56/13/2014   counseled     Family disruption 07/28/2010   GERD (gastroesophageal reflux disease)    Goiter    Hx: recurrent pneumonia    seen [facility]  neg   asthma related    History reviewed. No pertinent surgical history.  Family History  Problem Relation Age of Onset   Asthma  Mother    Bipolar disorder Mother        hx of substance abuse in past    Thyroid disease Mother    Other Mother        djd c spine myelopathy, drug abuse    Social History   Socioeconomic History   Marital status: Single    Spouse name: Not on file   Number of children: Not on file   Years of education: Not on file   Highest education level: Not on file  Occupational History   Not on file  Tobacco Use   Smoking status: Never   Smokeless tobacco: Never  Vaping Use   Vaping Use: Never used  Substance and Sexual Activity   Alcohol use: No   Drug use: No   Sexual activity: Not on file  Other Topics Concern   Not on file  Social History Narrative   zwas living with gm for 2-3 months 2015    Pet schnauzer   [employer] school  7th grade    Mom remarried   Husband left  Mom Jan  2012   Mom relapsed with codien given for her chronic neck pain and tried to detox herself   rehab and doing well as expected.     MOM relapsed again  2015    To live with aunt in [place]  For now   2016 mom is now one year sober Yager is living with her in an apartment in [place].   To be home schooled 9th grade    Social Determinants of Health   Financial Resource Strain: Not on file  Food Insecurity: Not on file  Transportation Needs: Not on file  Physical Activity: Not on file  Stress: Not on file  Social Connections: Not on file    Outpatient Medications Prior to Visit  Medication Sig Dispense Refill   albuterol (PROVENTIL) (2.5 MG/3ML) 0.083% nebulizer solution USE 1 VIAL VIA NEBULIZER EVERY 6 HOURS AS NEEDED FOR WHEEZING 1080 mL 0   albuterol (VENTOLIN HFA) 108 (90 Base) MCG/ACT inhaler INHALE 2 PUFFS INTO LUNGS EVERY 6 HOURS AS NEEDED FOR WHEEZING 8.5 each 3   MELATONIN PO Take by mouth.     methylphenidate (METADATE ER) 20 MG ER tablet Take 1 tablet (20 mg total) by mouth daily. 30 tablet 0   methylphenidate (RITALIN) 5 MG tablet At 2 pm on long school days or as  directed 45 tablet 0   budesonide-formoterol (SYMBICORT) 160-4.5 MCG/ACT inhaler Inhale 2 puffs into the lungs 2 (two) times daily. 1 each 6   No facility-administered medications prior to visit.     EXAM:  BP 114/80 (BP Location: Left Arm, Patient Position: Sitting, Cuff Size: Normal)    Pulse 96    Temp 98.8 F (37.1 C) (Oral)    Ht 5\' 6"  (1.676 m)    Wt 166 lb 3.2 oz (75.4 kg)    SpO2 99%    BMI 26.83 kg/m   Body mass index is 26.83 kg/m. Wt Readings from Last 3 Encounters:  04/10/21 166 lb 3.2 oz (75.4 kg)  01/24/21 175 lb (79.4 kg)  02/27/20 175 lb (79.4 kg)    Physical Exam: Vital signs reviewed 02/29/20 is a well-developed well-nourished alert cooperative    who appearsr stated age in no acute distress.  HEENT: normocephalic atraumatic , Eyes: PERRL EOM's full, conjunctiva clear, glasses ECK: supple without masses, or bruits.  Goiter present no specific nodules no tenderness CHEST/PULM:  Clear to auscultation and percussion breath sounds equal no wheeze , rales or rhonchi. No chest wall deformities or tenderness. Breast: normal by inspection . No dimpling, discharge, masses, tenderness or discharge . CV: PMI is nondisplaced, S1 S2 no gallops, murmurs, rubs. Peripheral pulses are full without delay.No JVD .  ABDOMEN: Bowel sounds normal nontender  No guard or rebound, no hepato splenomegal no CVA tenderness.   Extremtities:  No clubbing cyanosis or edema, no acute joint swelling or redness no focal atrophy NEURO:  Oriented x3, cranial nerves 3-12 appear to be intact, no obvious focal weakness,gait within normal limits no abnormal reflexes or asymmetrical SKIN: No acute rashes normal turgor, color, no bruising or petechiae. PSYCH: Oriented, good eye contact, no obvious depression anxiety, cognition and judgment appear normal. LN: no cervical axillary inguinal adenopathy  Lab Results  Component Value Date   WBC 8.3 10/09/2017   HGB 13.4 10/09/2017   HCT 40.0 10/09/2017    PLT 355.0 10/09/2017   GLUCOSE 94 10/09/2017   CHOL 148 10/09/2017   TRIG 34.0 10/09/2017   HDL 67.70 10/09/2017   LDLCALC 73  10/09/2017   ALT 16 10/09/2017   AST 19 10/09/2017   NA 139 10/09/2017   K 4.5 10/09/2017   CL 103 10/09/2017   CREATININE 0.54 10/09/2017   BUN 8 10/09/2017   CO2 27 10/09/2017   TSH 1.23 10/09/2017    BP Readings from Last 3 Encounters:  04/10/21 114/80  02/27/20 110/60  12/14/19 116/76      ASSESSMENT AND PLAN:  Discussed the following assessment and plan:    ICD-10-CM   1. Visit for preventive health examination  Z00.00     2. Medication management  Z79.899     3. Attention deficit hyperactivity disorder (ADHD), unspecified ADHD type  F90.9     4. Uncomplicated asthma, unspecified asthma severity, unspecified whether persistent  J45.909     5. Influenza vaccination declined by patient today  Z42.21     Insurance not covering controller inhaler currently using rescue about 3 times a week could be doing better Try sending in [place] discussed with pharmacy she did try calling insurance and did not get anywhere.  Her mom is on the same insurance and Symbicort apparently is covered. At this time she seems to be doing well on methylphenidate extended release no need for add-on although when school starts there may be times if she has a prolonged lab to do short acting out on.  She will let us know. Glad she is doing well follow-up 6 months consideration in the next year or so would repeat cholesterol lipids and thyroid and blood sugar anemia check. Declined flu vaccine today Return in about 6 months (around 10/09/2021) for medication lab if needed .  Patient Care Team: Sherrye Puga, Neta Mends, MD as PCP - General Kozlow, Alvira Philips, MD (General Practice) Patient Instructions  Good to see  you today .   Will try a different controller inhaler to see if insurance covers.   Same   med methylphenidate  contact us for refill when needed . Get more sleep    ...  Plan 6 months med check  Consider updating labs next year thyroid cholesterol  blood sugar  anemia check .   Pap usually done around  age 61 or soon there after depending on risk    Neta Mends. Girtha Kilgore M.D.

## 2021-04-10 NOTE — Patient Instructions (Addendum)
Good to see  you today .   Will try a different controller inhaler to see if insurance covers.   Same   med methylphenidate  contact us for refill when needed . Get more sleep   ...  Plan 6 months med check  Consider updating labs next year thyroid cholesterol  blood sugar  anemia check .   Pap usually done around  age 20 or soon there after depending on risk

## 2021-04-15 DIAGNOSIS — F411 Generalized anxiety disorder: Secondary | ICD-10-CM | POA: Diagnosis not present

## 2021-05-13 DIAGNOSIS — F411 Generalized anxiety disorder: Secondary | ICD-10-CM | POA: Diagnosis not present

## 2021-05-27 DIAGNOSIS — F411 Generalized anxiety disorder: Secondary | ICD-10-CM | POA: Diagnosis not present

## 2021-06-10 DIAGNOSIS — F411 Generalized anxiety disorder: Secondary | ICD-10-CM | POA: Diagnosis not present

## 2021-06-24 ENCOUNTER — Other Ambulatory Visit: Payer: Self-pay | Admitting: Internal Medicine

## 2021-06-25 ENCOUNTER — Telehealth: Payer: Self-pay | Admitting: Internal Medicine

## 2021-06-25 MED ORDER — METHYLPHENIDATE HCL ER 20 MG PO TBCR
20.0000 mg | EXTENDED_RELEASE_TABLET | Freq: Every day | ORAL | 0 refills | Status: DC
Start: 2021-06-25 — End: 2021-08-11

## 2021-06-25 NOTE — Telephone Encounter (Signed)
Rx sent to the pharmacy.

## 2021-06-25 NOTE — Telephone Encounter (Signed)
Last Ov 04/10/21 ?Filled 02/25/21 ?Is it ok to refill? ?

## 2021-06-25 NOTE — Telephone Encounter (Signed)
Patient called in requesting a refill for methylphenidate (METADATE ER) 20 MG ER tablet [id number] to be sent to her pharmacy. ? ?Please advise. ?

## 2021-07-08 DIAGNOSIS — F411 Generalized anxiety disorder: Secondary | ICD-10-CM | POA: Diagnosis not present

## 2021-07-22 DIAGNOSIS — F411 Generalized anxiety disorder: Secondary | ICD-10-CM | POA: Diagnosis not present

## 2021-08-05 DIAGNOSIS — F411 Generalized anxiety disorder: Secondary | ICD-10-CM | POA: Diagnosis not present

## 2021-08-08 ENCOUNTER — Telehealth: Payer: Self-pay | Admitting: Internal Medicine

## 2021-08-08 NOTE — Telephone Encounter (Signed)
Patient would like refill for 90 day supply on methylphenidate (METADATE ER) 20 MG ER tablet ? ? ? ? ? ?Please send to ?[id number] [place], [place] - [address] Phone:  [phone]  ?Fax:  [phone]  ?  ? ? ? ? ? ? ?Please advise  ?

## 2021-08-08 NOTE — Telephone Encounter (Signed)
Last Ov 04/10/22  ?Filled 06/25/21 ?Is it ok to refill? ?

## 2021-08-11 MED ORDER — METHYLPHENIDATE HCL ER 20 MG PO TBCR: 20.0000 mg | EXTENDED_RELEASE_TABLET | Freq: Every day | ORAL | 0 refills | Status: DC

## 2021-08-11 NOTE — Telephone Encounter (Signed)
Sent in electronically .  

## 2021-08-11 NOTE — Addendum Note (Signed)
Addended byShanon Ace K on: 08/11/2021 04:50 PM ? ? Modules accepted: Orders ? ?

## 2022-01-06 ENCOUNTER — Telehealth: Payer: Self-pay | Admitting: Internal Medicine

## 2022-01-07 ENCOUNTER — Telehealth: Payer: Self-pay | Admitting: Internal Medicine

## 2022-01-07 NOTE — Telephone Encounter (Addendum)
Patient called to schedule a surgical clearance visit with MD. Pt is having surgery (Rhinoplasty) on 01/28/22.   MD had nothing available for Pt's time frame, but agreed to let Pt see another provider to get surgical clearance.  Called Pt, left message for her to call back to schedule an OV for surgical clearance with another provider.

## 2022-01-07 NOTE — Telephone Encounter (Signed)
Please disregard

## 2022-01-10 NOTE — Telephone Encounter (Signed)
Pt called for surgical clearance on 01/07/22. MD did not have anything available within Pt's timeframe.  (I have always been told only PCP can give surgical clearance. Asked Co-workers,  who informed me that sometimes MD will make an exception).   Spoke with CMA and asked CMA to ask MD if she would make an exception for Pt to see another provider for surgical clearance.   CMA spoke to MD, who gave her okay.  Called Pt twice on 01/07/22, left 2 messages for Pt to call back for an appointment.  Pt never called back.  Just called Pt again to see if she still needs this appointment. Call was sent to voicemail. Left message for Pt to call the office, if she still needs surgical clearance.

## 2022-01-10 NOTE — Telephone Encounter (Signed)
Pt called back and apologized for missing my calls and stated she has been having problems with her phone all week.  Pt stated how very grateful she was for the call back, but no longer needed this appointment.

## 2022-02-10 ENCOUNTER — Other Ambulatory Visit: Payer: Self-pay | Admitting: Internal Medicine

## 2022-10-23 DIAGNOSIS — F330 Major depressive disorder, recurrent, mild: Secondary | ICD-10-CM | POA: Diagnosis not present

## 2022-10-23 DIAGNOSIS — F902 Attention-deficit hyperactivity disorder, combined type: Secondary | ICD-10-CM | POA: Diagnosis not present

## 2022-10-23 DIAGNOSIS — F411 Generalized anxiety disorder: Secondary | ICD-10-CM | POA: Diagnosis not present

## 2022-12-04 DIAGNOSIS — F330 Major depressive disorder, recurrent, mild: Secondary | ICD-10-CM | POA: Diagnosis not present

## 2022-12-04 DIAGNOSIS — F411 Generalized anxiety disorder: Secondary | ICD-10-CM | POA: Diagnosis not present

## 2022-12-04 DIAGNOSIS — F902 Attention-deficit hyperactivity disorder, combined type: Secondary | ICD-10-CM | POA: Diagnosis not present

## 2022-12-30 DIAGNOSIS — Z01419 Encounter for gynecological examination (general) (routine) without abnormal findings: Secondary | ICD-10-CM | POA: Diagnosis not present

## 2023-01-15 DIAGNOSIS — F330 Major depressive disorder, recurrent, mild: Secondary | ICD-10-CM | POA: Diagnosis not present

## 2023-01-15 DIAGNOSIS — F902 Attention-deficit hyperactivity disorder, combined type: Secondary | ICD-10-CM | POA: Diagnosis not present

## 2023-01-15 DIAGNOSIS — F411 Generalized anxiety disorder: Secondary | ICD-10-CM | POA: Diagnosis not present

## 2023-02-10 DIAGNOSIS — F902 Attention-deficit hyperactivity disorder, combined type: Secondary | ICD-10-CM | POA: Diagnosis not present

## 2023-02-10 DIAGNOSIS — F411 Generalized anxiety disorder: Secondary | ICD-10-CM | POA: Diagnosis not present

## 2023-02-10 DIAGNOSIS — F330 Major depressive disorder, recurrent, mild: Secondary | ICD-10-CM | POA: Diagnosis not present

## 2023-02-11 DIAGNOSIS — Z113 Encounter for screening for infections with a predominantly sexual mode of transmission: Secondary | ICD-10-CM | POA: Diagnosis not present

## 2023-02-11 DIAGNOSIS — Z124 Encounter for screening for malignant neoplasm of cervix: Secondary | ICD-10-CM | POA: Diagnosis not present

## 2023-02-27 ENCOUNTER — Other Ambulatory Visit: Payer: Self-pay | Admitting: Internal Medicine

## 2023-03-17 DIAGNOSIS — F330 Major depressive disorder, recurrent, mild: Secondary | ICD-10-CM | POA: Diagnosis not present

## 2023-03-17 DIAGNOSIS — F902 Attention-deficit hyperactivity disorder, combined type: Secondary | ICD-10-CM | POA: Diagnosis not present

## 2023-03-17 DIAGNOSIS — F411 Generalized anxiety disorder: Secondary | ICD-10-CM | POA: Diagnosis not present

## 2023-04-02 DIAGNOSIS — Z3040 Encounter for surveillance of contraceptives, unspecified: Secondary | ICD-10-CM | POA: Diagnosis not present

## 2023-05-05 DIAGNOSIS — F330 Major depressive disorder, recurrent, mild: Secondary | ICD-10-CM | POA: Diagnosis not present

## 2023-10-21 ENCOUNTER — Other Ambulatory Visit: Payer: Self-pay | Admitting: Family

## 2023-10-29 DIAGNOSIS — F330 Major depressive disorder, recurrent, mild: Secondary | ICD-10-CM | POA: Diagnosis not present

## 2023-10-29 DIAGNOSIS — F411 Generalized anxiety disorder: Secondary | ICD-10-CM | POA: Diagnosis not present

## 2023-10-29 DIAGNOSIS — F902 Attention-deficit hyperactivity disorder, combined type: Secondary | ICD-10-CM | POA: Diagnosis not present

## 2023-12-03 ENCOUNTER — Telehealth: Payer: Self-pay | Admitting: Internal Medicine

## 2023-12-03 NOTE — Telephone Encounter (Unsigned)
 Copied from CRM #[id number]. Topic: Clinical - Medication Refill >> Dec 03, 2023  9:32 AM Chasity T wrote: Medication: albuterol  (VENTOLIN  HFA) 108 (90 Base) MCG/ACT inhaler   Has the patient contacted their pharmacy? Yes  This is the patient's preferred pharmacy:    [id number] [place], [place] - [address] [place] [address] Phone: [phone] Fax: [phone]  Is this the correct pharmacy for this prescription? Yes If no, delete pharmacy and type the correct one.   Has the prescription been filled recently? No  Is the patient out of the medication? Yes  Has the patient been seen for an appointment in the last year OR does the patient have an upcoming appointment? Yes  Can we respond through MyChart? Yes  Agent: Please be advised that Rx refills may take up to 3 business days. We ask that you follow-up with your pharmacy.

## 2024-01-06 ENCOUNTER — Ambulatory Visit: Payer: Self-pay | Admitting: Internal Medicine

## 2024-01-06 ENCOUNTER — Encounter: Payer: Self-pay | Admitting: Internal Medicine

## 2024-01-06 VITALS — BP 102/64 | HR 90 | Temp 98.8°F | Resp 16 | Ht 66.0 in | Wt 150.0 lb

## 2024-01-06 DIAGNOSIS — Z79899 Other long term (current) drug therapy: Secondary | ICD-10-CM

## 2024-01-06 DIAGNOSIS — Z0000 Encounter for general adult medical examination without abnormal findings: Secondary | ICD-10-CM

## 2024-01-06 DIAGNOSIS — J45909 Unspecified asthma, uncomplicated: Secondary | ICD-10-CM

## 2024-01-06 MED ORDER — FLUTICASONE FUROATE-VILANTEROL 100-25 MCG/ACT IN AEPB: 1.0000 | INHALATION_SPRAY | Freq: Every day | RESPIRATORY_TRACT | 6 refills | Status: AC

## 2024-01-06 MED ORDER — ALBUTEROL SULFATE (2.5 MG/3ML) 0.083% IN NEBU: 2.5000 mg | INHALATION_SOLUTION | Freq: Every day | RESPIRATORY_TRACT | 0 refills | Status: AC | PRN

## 2024-01-06 NOTE — Progress Notes (Signed)
 Chief Complaint  Patient presents with   Annual Exam    HPI: Patient  Sydney [name]  23 y.o. comes in today for Preventive Health Care visit  last pv visit 12 22   ADDADHD: off an off. Med not now and doing ok  Asthma:  just got a new cat.     Asthma ok   began vaping since  stopped.   1 mos asthma ok this month .   Using  not on  On sertraline    from psych  and helpful.  Airline pilot  .work   Not a problem  .   No ocps  had se . Has had     Moving to live with BF [place] after he graduates   Health Maintenance  Topic Date Due   COVID-19 Vaccine (1) Never done   Pneumococcal Vaccine (2 of 2 - PCV) 12/27/2008   HIV Screening  Never done   Hepatitis C Screening  Never done   Hepatitis B Vaccines 19-59 Average Risk (1 of 3 - 19+ 3-dose series) Never done   Cervical Cancer Screening (Pap smear)  Never done   DTaP/Tdap/Td (2 - Td or Tdap) 12/02/2021   Influenza Vaccine  11/13/2023   HPV VACCINES  Completed   Meningococcal B Vaccine  Aged Out   Health Maintenance Review LIFESTYLE:  Exercise:   yes dog walking  Tobacco/ETS: Alcohol:  no Sugar beverages:  not ref  Sleep:  about 6 +  Drug use: no HH of  living with mom for now but then with BF  Work:pet sitting      ROS:  GEN/ HEENT: No fever, significant weight changes sweats headaches vision problems hearing changes, CV/ PULM; No chest pain shortness of breath cough, syncope,edema  change in exercise tolerance. GI /GU: No adominal pain, vomiting, change in bowel habits. No blood in the stool. No significant GU symptoms. SKIN/HEME: ,no acute skin rashes suspicious lesions or bleeding. No lymphadenopathy, nodules, masses.  NEURO/ PSYCH:  No neurologic signs such as weakness numbness. No depression anxiety. IMM/ Allergy: No unusual infections.  Allergy .   REST of 12 system review negative except as per HPI   Past Medical History:  Diagnosis Date   ADHD (attention deficit hyperactivity disorder)    Adjustment  disorder with anxious mood 09/26/2014   Is problematic and affecting behavior see multiple changes in caretakers over time but good support advised developmental pediatrician pediatric psych, counseli    Allergy    Asthma    BMI (body mass index), pediatric, 95-99% for age 47/13/2014   counseled     Family disruption 07/28/2010   GERD (gastroesophageal reflux disease)    Goiter    Hx: recurrent pneumonia    seen [facility]  neg   asthma related    No past surgical history on file.  Family History  Problem Relation Age of Onset   Asthma Mother    Bipolar disorder Mother        hx of substance abuse in past    Thyroid  disease Mother    Other Mother        djd c spine myelopathy, drug abuse    Social History   Socioeconomic History   Marital status: Single    Spouse name: Not on file   Number of children: Not on file   Years of education: Not on file   Highest education level: Not on file  Occupational History   Not on file  Tobacco Use   Smoking status: Never   Smokeless tobacco: Never  Vaping Use   Vaping status: Never Used  Substance and Sexual Activity   Alcohol use: No   Drug use: No   Sexual activity: Not on file  Other Topics Concern   Not on file  Social History Narrative   zwas living with gm for 2-3 months 2015    Pet schnauzer   [employer] school  7th grade    Mom remarried   Husband left  Mom Jan 2012   Mom relapsed with codien given for her chronic neck pain and tried to detox herself   rehab and doing well as expected.     MOM relapsed again  2015    To live with aunt in [place]  For now   2016 mom is now one year sober [place] is living with her in an apartment in [place].   To be home schooled 9th grade    Social Drivers of Corporate investment banker Strain: Not on file  Food Insecurity: Not on file  Transportation Needs: Not on file  Physical Activity: Not on file  Stress: Not on file  Social Connections: Not on file     Outpatient Medications Prior to Visit  Medication Sig Dispense Refill   albuterol  (VENTOLIN  HFA) 108 (90 Base) MCG/ACT inhaler INHALE 2 PUFFS INTO LUNGS EVERY 6 HOURS AS NEEDED FOR WHEEZING 8.5 each 3   sertraline (ZOLOFT) 100 MG tablet Take 100 mg by mouth daily.     MELATONIN PO Take by mouth. (Patient not taking: Reported on 01/06/2024)     methylphenidate  (METADATE  ER) 20 MG ER tablet Take 1 tablet (20 mg total) by mouth daily. (Patient not taking: Reported on 01/06/2024) 90 tablet 0   methylphenidate  (RITALIN ) 5 MG tablet At 2 pm on long school days or as directed (Patient not taking: Reported on 01/06/2024) 45 tablet 0   albuterol  (PROVENTIL ) (2.5 MG/3ML) 0.083% nebulizer solution USE 1 VIAL VIA NEBULIZER EVERY 6 HOURS AS NEEDED FOR WHEEZING 1080 mL 0   fluticasone  furoate-vilanterol (BREO ELLIPTA ) 100-25 MCG/ACT AEPB Inhale 1 puff into the lungs daily. Asthma controller (Patient not taking: Reported on 01/06/2024) 1 each 6   No facility-administered medications prior to visit.     EXAM:  BP 102/64   Pulse 90   Temp 98.8 F (37.1 C) (Oral)   Resp 16   Ht 5' 6 (1.676 m)   Wt 150 lb (68 kg)   LMP 12/13/2023 (Exact Date)   SpO2 97%   BMI 24.21 kg/m   Body mass index is 24.21 kg/m. Wt Readings from Last 3 Encounters:  01/06/24 150 lb (68 kg)  04/10/21 166 lb 3.2 oz (75.4 kg)  01/24/21 175 lb (79.4 kg)    Physical Exam: Vital signs reviewed HZW:Uypd is a well-developed well-nourished alert cooperative    who appearsr stated age in no acute distress.  HEENT: normocephalic atraumatic , Eyes: PERRL EOM's full, conjunctiva clear, Nares: paten,t no deformity discharge or tenderness., Ears: no deformity EAC's clear TMs with normal landmarks. Mouth: clear OP, no lesions, edema.  Moist mucous membranes. Dentition in adequate repair. NECK: supple without masses, thyroid  easily paplpable  or bruits. CHEST/PULM:  Clear to auscultation and percussion breath sounds equal no wheeze ,  rales or rhonchi. No chest wall deformities or tenderness. Breast: normal by inspection . No dimpling, discharge, masses, tenderness or discharge . CV: PMI is nondisplaced, S1 S2 no gallops,  murmurs, rubs. Peripheral pulses are full without delay.No JVD .  ABDOMEN: Bowel sounds normal nontender  No guard or rebound, no hepato splenomegal no CVA tenderness.  No hernia. Extremtities:  No clubbing cyanosis or edema, no acute joint swelling or redness no focal atrophy NEURO:  Oriented x3, cranial nerves 3-12 appear to be intact, no obvious focal weakness,gait within normal limits no abnormal reflexes or asymmetrical SKIN: No acute rashes normal turgor, color, no bruising or petechiae. PSYCH: Oriented, good eye contact, no obvious depression anxiety, cognition and judgment appear normal. LN: no cervical axillary adenopathy  Lab Results  Component Value Date   WBC 8.3 10/09/2017   HGB 13.4 10/09/2017   HCT 40.0 10/09/2017   PLT 355.0 10/09/2017   GLUCOSE 94 10/09/2017   CHOL 148 10/09/2017   TRIG 34.0 10/09/2017   HDL 67.70 10/09/2017   LDLCALC 73 10/09/2017   ALT 16 10/09/2017   AST 19 10/09/2017   NA 139 10/09/2017   K 4.5 10/09/2017   CL 103 10/09/2017   CREATININE 0.54 10/09/2017   BUN 8 10/09/2017   CO2 27 10/09/2017   TSH 1.23 10/09/2017    BP Readings from Last 3 Encounters:  01/06/24 102/64  04/10/21 114/80  02/27/20 110/60    ASSESSMENT AND PLAN:  Discussed the following assessment and plan:    ICD-10-CM   1. Visit for preventive health examination  Z00.00     2. Uncomplicated asthma, unspecified asthma severity, unspecified whether persistent  J45.909 albuterol  (PROVENTIL ) (2.5 MG/3ML) 0.083% nebulizer solution    3. Medication management  Z79.899     Declines   vaccines today  Disc   asthma control  insurance  for controller meds .  Will try again  uncertain whih is preferred at this time .  Can add nasal antihistamine and or incs  to help  Glad she is doing  well   Counsel healthy lifestyle gald she quit tobacco of both delivery systems  Return for depending on how doing or yearly .  Patient Care Team: Keyanni Whittinghill K, MD as PCP - General Kozlow, Camellia PARAS, MD (General Practice) Patient Instructions  Continue  tobacco  inhalation free  for asthma Trying for a  controller inhaler. Antihistamine  ok for allergy congestion.   Can try adding  nasal cortisone also .   And or astelin.   Continue lifestyle  healthy eating and exercise .   Jenny Omdahl K. Hallel Denherder M.D.

## 2024-01-06 NOTE — Patient Instructions (Signed)
 Continue  tobacco  inhalation free  for asthma Trying for a  controller inhaler. Antihistamine  ok for allergy congestion.   Can try adding  nasal cortisone also .   And or astelin.   Continue lifestyle  healthy eating and exercise .

## 2024-03-04 ENCOUNTER — Other Ambulatory Visit: Payer: Self-pay | Admitting: Internal Medicine

## 2024-03-04 NOTE — Telephone Encounter (Unsigned)
 Copied from CRM [id number]. Topic: Clinical - Medication Refill >> Mar 04, 2024 11:49 AM Burnard DEL wrote: Medication: albuterol  (VENTOLIN  HFA) 108 (90 Base) MCG/ACT inhaler  Has the patient contacted their pharmacy? Yes (Agent: If no, request that the patient contact the pharmacy for the refill. If patient does not wish to contact the pharmacy document the reason why and proceed with request.) (Agent: If yes, when and what did the pharmacy advise?)  This is the patient's preferred pharmacy:    [id number] [place], [place] - [address] [place] [address] Phone: [phone] Fax: [phone]  Is this the correct pharmacy for this prescription? Yes If no, delete pharmacy and type the correct one.   Has the prescription been filled recently? No  Is the patient out of the medication? Yes  Has the patient been seen for an appointment in the last year OR does the patient have an upcoming appointment? Yes  Can we respond through MyChart? Yes  Agent: Please be advised that Rx refills may take up to 3 business days. We ask that you follow-up with your pharmacy.

## 2024-03-07 MED ORDER — ALBUTEROL SULFATE HFA 108 (90 BASE) MCG/ACT IN AERS: INHALATION_SPRAY | RESPIRATORY_TRACT | 3 refills | Status: AC
